# Patient Record
Sex: Male | Born: 1985
Health system: Southern US, Community
[De-identification: ages and names within clinical notes are randomized; demographics above are authoritative.]

## PROBLEM LIST (undated history)

## (undated) DIAGNOSIS — I10 Essential (primary) hypertension: Secondary | ICD-10-CM

## (undated) HISTORY — DX: Essential (primary) hypertension: I10

---
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
## 2016-03-15 ENCOUNTER — Ambulatory Visit
Admission: RE | Admit: 2016-03-15 | Discharge: 2016-03-15 | Disposition: A | Discharge status: Home / Self Care | Payer: No Typology Code available for payment source | Source: Ambulatory Visit | Attending: Physical Medicine and Rehabilitation | Admitting: Physical Medicine and Rehabilitation

## 2016-03-15 ENCOUNTER — Other Ambulatory Visit: Payer: Self-pay | Admitting: Physical Medicine and Rehabilitation

## 2016-03-15 DIAGNOSIS — Z0000 Encounter for general adult medical examination without abnormal findings: Secondary | ICD-10-CM

## 2017-11-08 IMAGING — CR DG CHEST 1V
1 series · 1 of 1 positions shown · non-contrast
Comparison: None in PACs

CLINICAL DATA: Routine lung screening by and pleuro earlier,
nonsmoker.

EXAM:
CHEST 1 VIEW

[view not recorded]
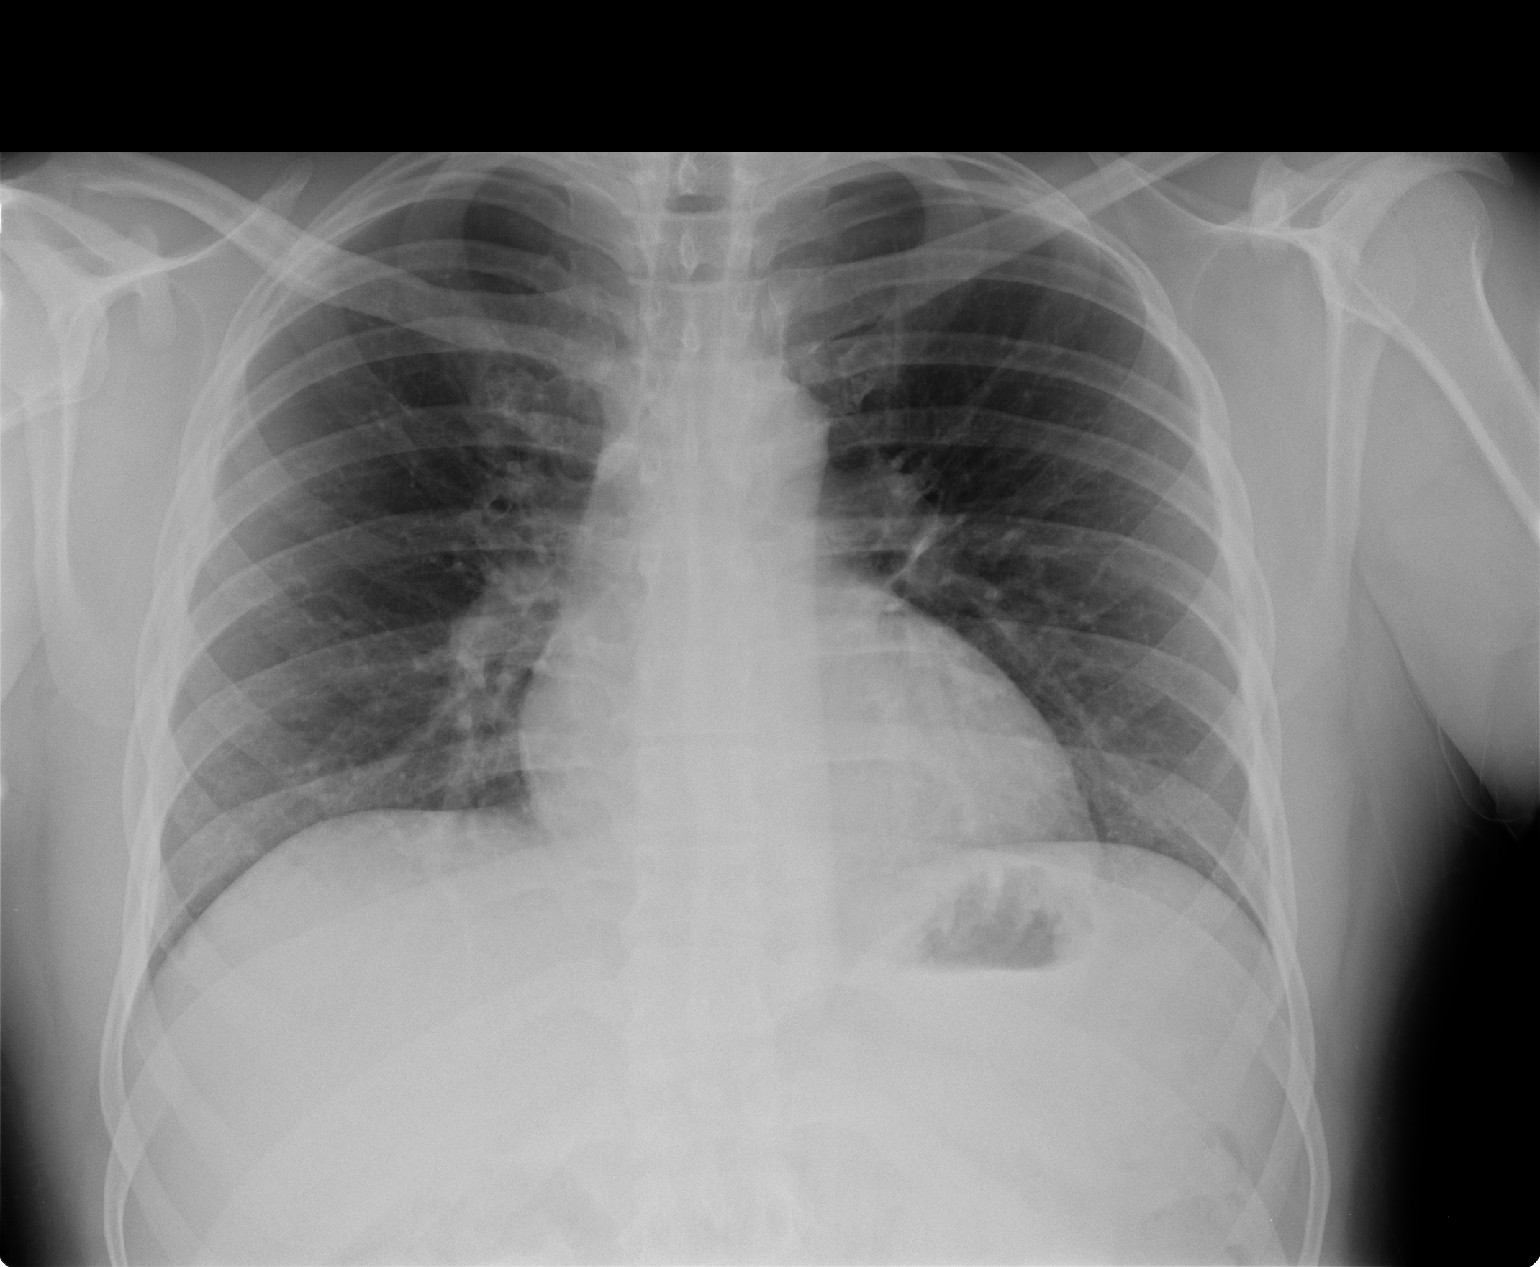

[1 of 1 positions shown; findings below may reference images not displayed]

FINDINGS: The lungs are adequately inflated and clear. The heart and pulmonary
vascularity are normal. The mediastinum is normal in width. There is
no pleural effusion. The bony thorax is unremarkable.
IMPRESSION: There is no active cardiopulmonary disease.

## 2019-07-14 ENCOUNTER — Encounter: Payer: Self-pay | Admitting: Urology

## 2019-07-14 ENCOUNTER — Ambulatory Visit (INDEPENDENT_AMBULATORY_CARE_PROVIDER_SITE_OTHER): Payer: 59 | Admitting: Urology

## 2019-07-14 VITALS — BP 163/101 | HR 109 | Temp 96.6°F | Ht 68.0 in | Wt 195.0 lb

## 2019-07-14 DIAGNOSIS — Z3009 Encounter for other general counseling and advice on contraception: Secondary | ICD-10-CM

## 2019-07-14 NOTE — Progress Notes (Signed)
H&P  Chief Complaint: Desire fpr sterility  History of Present Illness: 3 kids--11, 7 and 6.  Partner, Biomedical engineer Co native Works @ [employer] No prior GU hx.  Past Medical History:  Diagnosis Date  . Hypertension     History reviewed. No pertinent surgical history.  Home Medications:  Allergies as of 07/14/2019   No Known Allergies     Medication List       Accurate as of July 14, 2019 10:14 AM. If you have any questions, ask your nurse or doctor.        hydrochlorothiazide 12.5 MG tablet Commonly known as: HYDRODIURIL Take 12.5 mg by mouth daily.       Allergies: No Known Allergies  Family History  Problem Relation Age of Onset  . Hypertension Mother     Social History:  reports that he has never smoked. He does not have any smokeless tobacco history on file. He reports that he does not drink alcohol. No history on file for drug.  ROS: A complete review of systems was performed.  All systems are negative except for pertinent findings as noted.  Physical Exam:  Vital signs in last 24 hours: BP (!) 163/101   Pulse (!) 109   Temp (!) 96.6 F (35.9 C)   Ht 5\' 8"  (1.727 m)   Wt 88.5 kg   BMI 29.65 kg/m  Constitutional:  Alert and oriented, No acute distress Cardiovascular: Regular rate  Respiratory: Normal respiratory effort  Genitourinary: Normal male phallus, testes are descended bilaterally and non-tender and without masses, scrotum is normal in appearance without lesions or masses, perineum is normal on inspection. Lymphatic: No lymphadenopathy Neurologic: Grossly intact, no focal deficits Psychiatric: Normal mood and affect  Laboratory Data:  No results for input(s): WBC, HGB, HCT, PLT in the last 72 hours.  No results for input(s): NA, K, CL, GLUCOSE, BUN, CALCIUM, CREATININE in the last 72 hours.  Invalid input(s): CO3   No results found for this or any previous visit (from the past 24 hour(s)). No results found for this or any previous  visit (from the past 240 hour(s)).  Renal Function: No results for input(s): CREATININE in the last 168 hours. CrCl cannot be calculated (No successful lab value found.).  Radiologic Imaging: No results found.  Impression/Assessment:  Desire for sterility  Plan:   1. We discussed the procedure in detail, and I provided a vasectomy information packet.  He understands that this is a permanent form of sterilization. 2.  We discussed that vasectomy does not produce immediate infertility and he will need his semen sample showing no sperm before he can stop using any other form of contraception.  This typically takes 3 months. 3.  We discussed that there is a < 1/100 failure rate. 4.  I advised him to refrain from ejaculating for approximately 1 week. 5.  There is less than a 1% chance of developing a scrotal hematoma and equal chance of developing chronic scrotal pain. 6.   We will schedule his vasectomy at his convenience.

## 2019-07-14 NOTE — Progress Notes (Signed)

## 2019-08-25 ENCOUNTER — Encounter: Payer: No Typology Code available for payment source | Admitting: Urology

## 2019-09-29 ENCOUNTER — Encounter: Payer: No Typology Code available for payment source | Admitting: Urology

## 2019-10-08 ENCOUNTER — Other Ambulatory Visit: Payer: Self-pay | Admitting: Urology

## 2019-10-08 DIAGNOSIS — Z3009 Encounter for other general counseling and advice on contraception: Secondary | ICD-10-CM

## 2019-11-17 ENCOUNTER — Ambulatory Visit: Payer: No Typology Code available for payment source | Admitting: Urology

## 2019-11-17 ENCOUNTER — Other Ambulatory Visit: Payer: Self-pay

## 2020-01-11 ENCOUNTER — Encounter: Payer: Self-pay | Admitting: Emergency Medicine

## 2020-01-11 ENCOUNTER — Ambulatory Visit
Admission: EM | Admit: 2020-01-11 | Discharge: 2020-01-11 | Disposition: A | Discharge status: Home / Self Care | Payer: 59 | Attending: Emergency Medicine | Admitting: Emergency Medicine

## 2020-01-11 ENCOUNTER — Other Ambulatory Visit: Payer: Self-pay

## 2020-01-11 DIAGNOSIS — R1032 Left lower quadrant pain: Secondary | ICD-10-CM | POA: Diagnosis not present

## 2020-01-11 DIAGNOSIS — R319 Hematuria, unspecified: Secondary | ICD-10-CM

## 2020-01-11 DIAGNOSIS — R102 Pelvic and perineal pain: Secondary | ICD-10-CM | POA: Diagnosis present

## 2020-01-11 DIAGNOSIS — N200 Calculus of kidney: Secondary | ICD-10-CM

## 2020-01-11 LAB — POCT URINALYSIS DIP (MANUAL ENTRY)
Bilirubin, UA: NEGATIVE
Glucose, UA: NEGATIVE mg/dL
Ketones, POC UA: NEGATIVE mg/dL
Leukocytes, UA: NEGATIVE
Nitrite, UA: NEGATIVE
Protein Ur, POC: NEGATIVE mg/dL
Spec Grav, UA: 1.025 (ref 1.010–1.025)
Urobilinogen, UA: 0.2 E.U./dL
pH, UA: 6.5 (ref 5.0–8.0)

## 2020-01-11 MED ORDER — TRAMADOL HCL 50 MG PO TABS: 50.0000 mg | ORAL_TABLET | Freq: Two times a day (BID) | ORAL | 0 refills | Status: DC | PRN

## 2020-01-11 MED ORDER — IBUPROFEN 800 MG PO TABS
800.0000 mg | ORAL_TABLET | Freq: Three times a day (TID) | ORAL | 0 refills | Status: DC
Start: 2020-01-11 — End: 2021-06-08

## 2020-01-11 MED ORDER — KETOROLAC TROMETHAMINE 60 MG/2ML IM SOLN
60.0000 mg | Freq: Once | INTRAMUSCULAR | Status: AC
  Administered 2020-01-11: 60 mg via INTRAMUSCULAR

## 2020-01-11 NOTE — ED Triage Notes (Signed)
Pain below umbilicus area, no urinary s/s .  Hx of kidney stones

## 2020-01-11 NOTE — ED Provider Notes (Signed)
[facility]   CC: Abdominal discomfort  SUBJECTIVE:  David Cain is a 34 y.o. male who complains of abdominal discomfort x 1 day.  Patient denies a precipitating event or trauma.  Localizes the pain to the lower abdomen.  Pain is intermittent and describes it as sharp.  Has tried OTC medications without relief.  Denies aggravating factors.  Admits to similar symptoms in the past with kidney stones.  Denies fever, chills, nausea, vomiting, abdominal pain, flank pain, hematuria.    LMP: No LMP for male patient.  ROS: As in HPI.  All other pertinent ROS negative.     Past Medical History:  Diagnosis Date  . Hypertension    History reviewed. No pertinent surgical history. No Known Allergies No current facility-administered medications on file prior to encounter.   Current Outpatient Medications on File Prior to Encounter  Medication Sig Dispense Refill  . hydrochlorothiazide (HYDRODIURIL) 12.5 MG tablet Take 12.5 mg by mouth daily.     Social History   Socioeconomic History  . Marital status: Single    Spouse name: Not on file  . Number of children: Not on file  . Years of education: Not on file  . Highest education level: Not on file  Occupational History  . Not on file  Tobacco Use  . Smoking status: Never Smoker  . Smokeless tobacco: Never Used  Substance and Sexual Activity  . Alcohol use: Never  . Drug use: Never  . Sexual activity: Not on file  Other Topics Concern  . Not on file  Social History Narrative   2 sons 1 daughter    Social Determinants of Health   Financial Resource Strain:   . Difficulty of Paying Living Expenses:   Food Insecurity:   . Worried About Programme researcher, broadcasting/film/video in the Last Year:   . Barista in the Last Year:   Transportation Needs:   . Freight forwarder (Medical):   Marland Kitchen Lack of Transportation (Non-Medical):   Physical Activity:   . Days of Exercise per Week:   . Minutes of Exercise per Session:   Stress:     . Feeling of Stress :   Social Connections:   . Frequency of Communication with Friends and Family:   . Frequency of Social Gatherings with Friends and Family:   . Attends Religious Services:   . Active Member of Clubs or Organizations:   . Attends Banker Meetings:   Marland Kitchen Marital Status:   Intimate Partner Violence:   . Fear of Current or Ex-Partner:   . Emotionally Abused:   Marland Kitchen Physically Abused:   . Sexually Abused:    Family History  Problem Relation Age of Onset  . Hypertension Mother     OBJECTIVE:  Vitals:   01/11/20 1419 01/11/20 1431  BP: (!) 163/118   Pulse: (!) 113   Resp: 17   Temp: 98.8 F (37.1 C)   TempSrc: Oral   SpO2: 97%   Weight:  194 lb 0.1 oz (88 kg)  Height:  5\' 8"  (1.727 m)   General appearance: Alert in no acute distress HEENT: NCAT.  Oropharynx clear.  Lungs: clear to auscultation bilaterally without adventitious breath sounds Heart: regular rate and rhythm.   Abdomen: soft; non-distended; TTP over LLQ and suprapubic region; no guarding Back: no CVA tenderness Extremities: no edema; symmetrical with no gross deformities Skin: warm and dry Neurologic: Ambulates from chair to exam table without difficulty Psychological: alert and  cooperative; normal mood and affect  LABS:  Results for orders placed or performed during the hospital encounter of 01/11/20 (from the past 24 hour(s))  POCT urinalysis dipstick     Status: Abnormal   Collection Time: 01/11/20  2:48 PM  Result Value Ref Range   Color, UA yellow yellow   Clarity, UA clear clear   Glucose, UA negative negative mg/dL   Bilirubin, UA negative negative   Ketones, POC UA negative negative mg/dL   Spec Grav, UA 1.025 1.010 - 1.025   Blood, UA moderate (A) negative   pH, UA 6.5 5.0 - 8.0   Protein Ur, POC negative negative mg/dL   Urobilinogen, UA 0.2 0.2 or 1.0 E.U./dL   Nitrite, UA Negative Negative   Leukocytes, UA Negative Negative     ASSESSMENT & PLAN:  1. LLQ  discomfort   2. Suprapubic discomfort   3. Hematuria, unspecified type   4. Kidney stone     Meds ordered this encounter  Medications  . ibuprofen (ADVIL) 800 MG tablet    Sig: Take 1 tablet (800 mg total) by mouth 3 (three) times daily.    Dispense:  21 tablet    Refill:  0    Order Specific Question:   Supervising Provider    Answer:   Raylene Everts [[id number]]  . traMADol (ULTRAM) 50 MG tablet    Sig: Take 1 tablet (50 mg total) by mouth every 12 (twelve) hours as needed.    Dispense:  10 tablet    Refill:  0    Order Specific Question:   Supervising Provider    Answer:   Raylene Everts [[id number]]  . ketorolac (TORADOL) injection 60 mg   Toradol prescribed.  Take as directed and to completion Drink plenty of fluids and get rest Ibuprofen prescribed.  Take as directed Tramadol for severe break-through pain.  DO NOT TAKE WHILE DRIVING OR OPERATING HEAVY MACHINERY Follow up with PCP if symptoms persist Return or go to ER if you have any new or worsening symptoms (difficulty urinating, blood in urine, pain that does not moderate with medication, fever, chills, abdominal pain, etc...)   Outlined signs and symptoms indicating need for more acute intervention. Patient verbalized understanding. After Visit Summary given.     Lestine Box, PA-C 01/11/20 1505

## 2020-01-11 NOTE — Discharge Instructions (Signed)
Toradol prescribed.  Take as directed and to completion Drink plenty of fluids and get rest Ibuprofen prescribed.  Take as directed Tramadol for severe break-through pain.  DO NOT TAKE WHILE DRIVING OR OPERATING HEAVY MACHINERY Follow up with PCP if symptoms persist Return or go to ER if you have any new or worsening symptoms (difficulty urinating, blood in urine, pain that does not moderate with medication, fever, chills, abdominal pain, etc...)

## 2020-01-13 LAB — URINE CULTURE: Culture: NO GROWTH

## 2020-01-23 ENCOUNTER — Ambulatory Visit
Admission: EM | Admit: 2020-01-23 | Discharge: 2020-01-23 | Disposition: A | Discharge status: Home / Self Care | Payer: 59 | Attending: Emergency Medicine | Admitting: Emergency Medicine

## 2020-01-23 DIAGNOSIS — R300 Dysuria: Secondary | ICD-10-CM | POA: Diagnosis present

## 2020-01-23 DIAGNOSIS — Z113 Encounter for screening for infections with a predominantly sexual mode of transmission: Secondary | ICD-10-CM | POA: Insufficient documentation

## 2020-01-23 LAB — POCT URINALYSIS DIP (MANUAL ENTRY)
Glucose, UA: NEGATIVE mg/dL
Leukocytes, UA: NEGATIVE
Nitrite, UA: NEGATIVE
Protein Ur, POC: NEGATIVE mg/dL
Spec Grav, UA: 1.02 (ref 1.010–1.025)
Urobilinogen, UA: 0.2 E.U./dL
pH, UA: 6.5 (ref 5.0–8.0)

## 2020-01-23 MED ORDER — PHENAZOPYRIDINE HCL 100 MG PO TABS
100.0000 mg | ORAL_TABLET | Freq: Three times a day (TID) | ORAL | 0 refills | Status: DC | PRN
Start: 2020-01-23 — End: 2021-06-08

## 2020-01-23 NOTE — ED Triage Notes (Signed)
Pt returns without much improvement in symptoms since last visit

## 2020-01-23 NOTE — ED Provider Notes (Signed)
[facility]   Chief Complaint  Patient presents with   Flank Pain     SUBJECTIVE:  David Cain is a 34 y.o. male who complains of dysuria for the past few days.  Was previously seen for kidney stone and was prescribed Toradol ibuprofen.  Patient reports symptom has resolved.  Denies having some irritation while urinating.  Denies any precipitating event.  Report he is sexually active with 1 male partner.  Denies penile discharge. Has tried OTC medications without relief.  Symptoms are made worse with urination.  Admits to similar symptoms in the past.  Denies fever, chills, nausea, vomiting, abdominal pain, flank pain or hematuria.    LMP: No LMP for male patient.  ROS: As in HPI.  All other pertinent ROS negative.     Past Medical History:  Diagnosis Date   Hypertension    History reviewed. No pertinent surgical history. No Known Allergies No current facility-administered medications on file prior to encounter.   Current Outpatient Medications on File Prior to Encounter  Medication Sig Dispense Refill   hydrochlorothiazide (HYDRODIURIL) 12.5 MG tablet Take 12.5 mg by mouth daily.     ibuprofen (ADVIL) 800 MG tablet Take 1 tablet (800 mg total) by mouth 3 (three) times daily. 21 tablet 0   traMADol (ULTRAM) 50 MG tablet Take 1 tablet (50 mg total) by mouth every 12 (twelve) hours as needed. 10 tablet 0   Social History   Socioeconomic History   Marital status: Single    Spouse name: Not on file   Number of children: Not on file   Years of education: Not on file   Highest education level: Not on file  Occupational History   Not on file  Tobacco Use   Smoking status: Never Smoker   Smokeless tobacco: Never Used  Substance and Sexual Activity   Alcohol use: Never   Drug use: Never   Sexual activity: Not on file  Other Topics Concern   Not on file  Social History Narrative   2 sons 1 daughter    Social Determinants of Health    Financial Resource Strain:    Difficulty of Paying Living Expenses:   Food Insecurity:    Worried About Programme researcher, broadcasting/film/video in the Last Year:    Barista in the Last Year:   Transportation Needs:    Freight forwarder (Medical):    Lack of Transportation (Non-Medical):   Physical Activity:    Days of Exercise per Week:    Minutes of Exercise per Session:   Stress:    Feeling of Stress :   Social Connections:    Frequency of Communication with Friends and Family:    Frequency of Social Gatherings with Friends and Family:    Attends Religious Services:    Active Member of Clubs or Organizations:    Attends Engineer, structural:    Marital Status:   Intimate Partner Violence:    Fear of Current or Ex-Partner:    Emotionally Abused:    Physically Abused:    Sexually Abused:    Family History  Problem Relation Age of Onset   Hypertension Mother     OBJECTIVE:  Vitals:   01/23/20 1439  BP: (!) 153/123  Pulse: 100  Resp: 16  Temp: 98.4 F (36.9 C)  TempSrc: Oral  SpO2: 98%   General appearance: AOx3 in no acute distress HEENT: NCAT.  Oropharynx clear.  Lungs: clear to auscultation bilaterally  without adventitious breath sounds Heart: regular rate and rhythm.  Radial pulses 2+ symmetrical bilaterally Abdomen: soft; non-distended; no tenderness; bowel sounds present; no guarding or rebound tenderness Back: no CVA tenderness Extremities: no edema; symmetrical with no gross deformities Skin: warm and dry Neurologic: Ambulates from chair to exam table without difficulty Psychological: alert and cooperative; normal mood and affect  Labs Reviewed  POCT URINALYSIS DIP (MANUAL ENTRY) - Abnormal; Notable for the following components:      Result Value   Bilirubin, UA small (*)    Ketones, POC UA trace (5) (*)    Blood, UA small (*)    All other components within normal limits  URINE CULTURE  CYTOLOGY, (ORAL, ANAL, URETHRAL)  ANCILLARY ONLY    ASSESSMENT & PLAN:  1. Dysuria   2. Screening for STD (sexually transmitted disease)     Meds ordered this encounter  Medications   phenazopyridine (PYRIDIUM) 100 MG tablet    Sig: Take 1 tablet (100 mg total) by mouth 3 (three) times daily as needed for pain.    Dispense:  10 tablet    Refill:  0   Patient is stable at discharge.  Pyridium was prescribed.  Will await STD screening and urine culture test.  Discharge instructions Urine culture sent.  We will call you with the results. Penile self swab was completed Push fluids and get plenty of rest.   Take pyridium as prescribed and as needed for symptomatic relief Follow up with PCP if symptoms persists Return here or go to ER if you have any new or worsening symptoms such as fever, worsening abdominal pain, nausea/vomiting, flank pain, etc...   Outlined signs and symptoms indicating need for more acute intervention. Patient verbalized understanding. After Visit Summary given.     Durward Parcel, FNP 01/23/20 1521

## 2020-01-23 NOTE — Discharge Instructions (Addendum)
Urine culture sent.  We will call you with the results. Penile self swab was completed Push fluids and get plenty of rest.   Take pyridium as prescribed and as needed for symptomatic relief Follow up with PCP if symptoms persists Return here or go to ER if you have any new or worsening symptoms such as fever, worsening abdominal pain, nausea/vomiting, flank pain, etc..Marland Kitchen

## 2020-01-24 LAB — URINE CULTURE: Culture: NO GROWTH

## 2020-01-26 LAB — CYTOLOGY, (ORAL, ANAL, URETHRAL) ANCILLARY ONLY
Chlamydia: NEGATIVE
Comment: NEGATIVE
Comment: NEGATIVE
Comment: NORMAL
Neisseria Gonorrhea: NEGATIVE
Trichomonas: NEGATIVE

## 2021-03-16 ENCOUNTER — Other Ambulatory Visit: Payer: Self-pay

## 2021-03-16 ENCOUNTER — Ambulatory Visit
Admission: EM | Admit: 2021-03-16 | Discharge: 2021-03-16 | Disposition: A | Discharge status: Home / Self Care | Payer: 59 | Attending: Emergency Medicine | Admitting: Emergency Medicine

## 2021-03-16 DIAGNOSIS — Z1152 Encounter for screening for COVID-19: Secondary | ICD-10-CM

## 2021-03-16 NOTE — ED Triage Notes (Signed)
Pt wants covid test from c/o headache the other day, no other symptoms

## 2021-03-18 LAB — NOVEL CORONAVIRUS, NAA: SARS-CoV-2, NAA: NOT DETECTED

## 2021-03-18 LAB — SARS-COV-2, NAA 2 DAY TAT

## 2021-05-18 ENCOUNTER — Encounter (HOSPITAL_COMMUNITY): Payer: Self-pay

## 2021-05-18 ENCOUNTER — Emergency Department (HOSPITAL_COMMUNITY)
Admission: EM | Admit: 2021-05-18 | Discharge: 2021-05-18 | Disposition: A | Discharge status: Home / Self Care | Payer: 59 | Attending: Emergency Medicine | Admitting: Emergency Medicine

## 2021-05-18 ENCOUNTER — Other Ambulatory Visit: Payer: Self-pay

## 2021-05-18 DIAGNOSIS — Z79899 Other long term (current) drug therapy: Secondary | ICD-10-CM | POA: Insufficient documentation

## 2021-05-18 DIAGNOSIS — I10 Essential (primary) hypertension: Secondary | ICD-10-CM | POA: Diagnosis not present

## 2021-05-18 DIAGNOSIS — E871 Hypo-osmolality and hyponatremia: Secondary | ICD-10-CM | POA: Insufficient documentation

## 2021-05-18 DIAGNOSIS — R0981 Nasal congestion: Secondary | ICD-10-CM | POA: Insufficient documentation

## 2021-05-18 DIAGNOSIS — R5383 Other fatigue: Secondary | ICD-10-CM

## 2021-05-18 DIAGNOSIS — Z20822 Contact with and (suspected) exposure to covid-19: Secondary | ICD-10-CM | POA: Insufficient documentation

## 2021-05-18 DIAGNOSIS — I159 Secondary hypertension, unspecified: Secondary | ICD-10-CM

## 2021-05-18 DIAGNOSIS — R519 Headache, unspecified: Secondary | ICD-10-CM | POA: Insufficient documentation

## 2021-05-18 LAB — BASIC METABOLIC PANEL
Anion gap: 9 (ref 5–15)
BUN: 9 mg/dL (ref 6–20)
CO2: 26 mmol/L (ref 22–32)
Calcium: 9.2 mg/dL (ref 8.9–10.3)
Chloride: 98 mmol/L (ref 98–111)
Creatinine, Ser: 1.03 mg/dL (ref 0.61–1.24)
GFR, Estimated: >60 mL/min (ref >60)
Glucose, Bld: 98 mg/dL (ref 70–99)
Potassium: 3.7 mmol/L (ref 3.5–5.1)
Sodium: 133 mmol/L — ABNORMAL LOW (ref 135–145)

## 2021-05-18 LAB — CBC WITH DIFFERENTIAL/PLATELET
Abs Immature Granulocytes: 0.05 10*3/uL (ref 0.00–0.07)
Basophils Absolute: 0 10*3/uL (ref 0.0–0.1)
Basophils Relative: 0 %
Eosinophils Absolute: 0 10*3/uL (ref 0.0–0.5)
Eosinophils Relative: 0 %
HCT: 51.5 % (ref 39.0–52.0)
Hemoglobin: 16.9 g/dL (ref 13.0–17.0)
Immature Granulocytes: 1 %
Lymphocytes Relative: 11 %
Lymphs Abs: 1.1 10*3/uL (ref 0.7–4.0)
MCH: 29.1 pg (ref 26.0–34.0)
MCHC: 32.8 g/dL (ref 30.0–36.0)
MCV: 88.8 fL (ref 80.0–100.0)
Monocytes Absolute: 0.7 10*3/uL (ref 0.1–1.0)
Monocytes Relative: 7 %
Neutro Abs: 8.3 10*3/uL — ABNORMAL HIGH (ref 1.7–7.7)
Neutrophils Relative %: 81 %
Platelets: 274 10*3/uL (ref 150–400)
RBC: 5.8 MIL/uL (ref 4.22–5.81)
RDW: 13 % (ref 11.5–15.5)
WBC: 10.2 10*3/uL (ref 4.0–10.5)
nRBC: 0 % (ref 0.0–0.2)

## 2021-05-18 MED ORDER — AMLODIPINE BESYLATE 5 MG PO TABS
5.0000 mg | ORAL_TABLET | Freq: Once | ORAL | Status: AC
  Administered 2021-05-18: 5 mg via ORAL
  Filled 2021-05-18: qty 1

## 2021-05-18 NOTE — ED Provider Notes (Signed)
[facility] EMERGENCY DEPARTMENT Provider Note   CSN: [id number] Arrival date & time: 05/18/21  2056     History Chief Complaint  Patient presents with   Headache    David Cain is a 35 y.o. male.  HPI  Patient with significant medical history of hypertension presents to the emergency department with headache.  He states that headache started today, states he initially felt a headache behind his neck and then radiate all throughout his head, headache was constant, he had no associated photophobia, change in vision, paresthesia or weakness upper lower extremities, denies any recent head trauma, is not on anticoagulant.  She states he currently does not have much of a headache right now,  just feels fatigued.  He notes that earlier today he had an episode where he felt short of breath, states he was laying on the couch and felt like his chest was a little tight but it went away on its own, he currently does not have this complaint this time.  He also notes that he has been having some subjective fevers and chills, he states this all started today decrease in appetite, he denies  nasal congestion, sore throat, cough, stomach pain, nausea, vomiting, diarrhea, general body aches.  He denies  recent sick contacts, is vaccine against COVID but has not gotten his flu shot.  He has no other complaints at this time.  He denies any leaving or aggravating factors at this time.  Past Medical History:  Diagnosis Date   Hypertension     There are no problems to display for this patient.   No past surgical history on file.     Family History  Problem Relation Age of Onset   Hypertension Mother     Social History   Tobacco Use   Smoking status: Never   Smokeless tobacco: Never  Substance Use Topics   Alcohol use: Never   Drug use: Never    Home Medications Prior to Admission medications   Medication Sig Start Date End Date Taking? Authorizing Provider  hydrochlorothiazide  (HYDRODIURIL) 12.5 MG tablet Take 12.5 mg by mouth daily. 07/11/19   [provider]  ibuprofen (ADVIL) 800 MG tablet Take 1 tablet (800 mg total) by mouth 3 (three) times daily. 01/11/20   Wurst, [place], PA-C  phenazopyridine (PYRIDIUM) 100 MG tablet Take 1 tablet (100 mg total) by mouth 3 (three) times daily as needed for pain. 01/23/20   Avegno, Zachery Dakins, FNP  traMADol (ULTRAM) 50 MG tablet Take 1 tablet (50 mg total) by mouth every 12 (twelve) hours as needed. 01/11/20   Wurst, [place], PA-C    Allergies    Patient has no known allergies.  Review of Systems   Review of Systems  Constitutional:  Positive for appetite change and fatigue. Negative for chills and fever.  HENT:  Negative for congestion.   Respiratory:  Negative for shortness of breath.   Cardiovascular:  Negative for chest pain.  Gastrointestinal:  Negative for abdominal pain, diarrhea, nausea and vomiting.  Genitourinary:  Negative for enuresis.  Musculoskeletal:  Negative for back pain.  Skin:  Negative for rash.  Neurological:  Positive for headaches. Negative for dizziness.  Hematological:  Does not bruise/bleed easily.   Physical Exam Updated Vital Signs BP (!) 178/116 (BP Location: Left Arm) Comment: PT is on BP medication. pt sates he took his BP medication today  Pulse 87   Temp 98.3 F (36.8 C) (Oral)   Resp 20   Ht  5\' 8"  (1.727 m)   Wt 95.3 kg   SpO2 99%   BMI 31.93 kg/m   Physical Exam Vitals and nursing note reviewed.  Constitutional:      General: He is not in acute distress.    Appearance: He is not ill-appearing.  HENT:     Head: Normocephalic and atraumatic.     Right Ear: Tympanic membrane, ear canal and external ear normal.     Left Ear: Tympanic membrane, ear canal and external ear normal.     Nose: Congestion present.     Comments: Erythematous turbinates bilaterally.    Mouth/Throat:     Mouth: Mucous membranes are moist.     Pharynx: Oropharynx is clear. No oropharyngeal  exudate or posterior oropharyngeal erythema.  Eyes:     Conjunctiva/sclera: Conjunctivae normal.  Cardiovascular:     Rate and Rhythm: Normal rate and regular rhythm.     Pulses: Normal pulses.     Heart sounds: No murmur heard.   No friction rub. No gallop.  Pulmonary:     Effort: No respiratory distress.     Breath sounds: No wheezing, rhonchi or rales.  Abdominal:     Palpations: Abdomen is soft.     Tenderness: There is no abdominal tenderness. There is no right CVA tenderness or left CVA tenderness.  Musculoskeletal:     Comments: Moving all 4 extremities, 5/5 strength, neurovascularly intact.  Skin:    General: Skin is warm and dry.  Neurological:     Mental Status: He is alert.     Comments: No facial asymmetry, no difficult word finding, no slurring of his words, able to follow two-step commands, Weakness present my exam, gait fully intact.  Psychiatric:        Mood and Affect: Mood normal.    ED Results / Procedures / Treatments   Labs (all labs ordered are listed, but only abnormal results are displayed) Labs Reviewed  RESP PANEL BY RT-PCR (FLU A&B, COVID) ARPGX2  BASIC METABOLIC PANEL  CBC WITH DIFFERENTIAL/PLATELET    EKG None  Radiology No results found.  Procedures Procedures   Medications Ordered in ED Medications  amLODipine (NORVASC) tablet 5 mg (has no administration in time range)    ED Course  I have reviewed the triage vital signs and the nursing notes.  Pertinent labs & imaging results that were available during my care of the patient were reviewed by me and considered in my medical decision making (see chart for details).    MDM Rules/Calculators/A&P                          Initial impression-presents with fatigue, he is alert, does not appear acute stress, vital signs normal for hypertension BP of 178/116, unclear etiology, will check electrolytes, hemoglobin, EKG and reassess.  Work-up-CBC is unremarkable, BMP shows hyponatremia  133, EKG sinus without signs of ischemia.  Reassessment-patient is reassessed continued no complaints at this time.  Patient agreed for discharge.  Rule out-low suspicion for ACS patient has chest pain, shortness of breath, EKG is without signs of ischemia.  Low suspicion for PE as patient is PERC negative.  I have low suspicion for electrolyte abnormality as BMP is unremarkable.  I have low suspicion for hypertensive emergency or urgency as there is no signs of organ damage present on exam within lab work.  I have low suspicion for CVA or intracranial head bleed as there is no focal deficits  present my exam, patient has no risk factor he is not on anticoagulant and denies any recent head trauma.  Plan-  Fatigue-unclear etiology I suspect probable viral infection, will have him follow-up on his respiratory panel, if positive for COVID/influenza would not advise for antiviral treatment as he has very mild symptoms and is not immunocompromise low risk factors for adverse outcome. Hypertension-slightly elevated today, we will have him continue to monitor this, follow-up with his PCP for further evaluation.  Vital signs have remained stable, no indication for hospital admission.   Patient given at home care as well strict return precautions.  Patient verbalized that they understood agreed to said plan.  Final Clinical Impression(s) / ED Diagnoses Final diagnoses:  None    Rx / DC Orders ED Discharge Orders     None        Barnie Del 05/18/21 2303    Cathren Laine, MD 05/20/21 [phone]

## 2021-05-18 NOTE — ED Triage Notes (Signed)
Pt c/o headache that started this morning, states he took Excedrin with no relief. Also c/o congestion.

## 2021-05-18 NOTE — Discharge Instructions (Signed)
Lab work and exam are all reassuring.  Your respiratory panel is pending at this time please follow-up on your MyChart account.  If you are COVID-positive you must quarantine for 5 days after that you must wear a mask for additional 5 more days.    your blood pressure was slightly elevated today please continue with your blood pressure medications, if they continue to stay high please follow your PCP for further evaluation.  Come back to the emergency department if you develop chest pain, shortness of breath, severe abdominal pain, uncontrolled nausea, vomiting, diarrhea.

## 2021-05-19 LAB — RESP PANEL BY RT-PCR (FLU A&B, COVID) ARPGX2
Influenza A by PCR: NEGATIVE
Influenza B by PCR: NEGATIVE
SARS Coronavirus 2 by RT PCR: NEGATIVE

## 2021-06-08 ENCOUNTER — Encounter: Payer: Self-pay | Admitting: Emergency Medicine

## 2021-06-08 ENCOUNTER — Ambulatory Visit
Admission: EM | Admit: 2021-06-08 | Discharge: 2021-06-08 | Disposition: A | Discharge status: Home / Self Care | Payer: 59 | Attending: Emergency Medicine | Admitting: Emergency Medicine

## 2021-06-08 ENCOUNTER — Other Ambulatory Visit: Payer: Self-pay

## 2021-06-08 DIAGNOSIS — I10 Essential (primary) hypertension: Secondary | ICD-10-CM | POA: Diagnosis not present

## 2021-06-08 DIAGNOSIS — H5789 Other specified disorders of eye and adnexa: Secondary | ICD-10-CM

## 2021-06-08 NOTE — Discharge Instructions (Addendum)
New contacts.  Systane lubricating eyedrops as often as you want.  Cool compresses should also help.  Please follow-up with your eye doctor if you continue to have problems with your contacts.  You may need to switch brands.  Please keep an eye on your blood pressure. Decrease your salt intake. diet and exercise will lower your blood pressure significantly. It is important to keep your blood pressure under good control, as having a elevated blood pressure for prolonged periods of time significantly increases your risk of stroke, heart attacks, kidney damage, eye damage, and other problems. Measure your blood pressure once a day, preferably at the same time every day. Keep a log of this and bring it to your next doctor's appointment.  Bring your blood pressure cuff as well. Return immediately to the ER if you start having chest pain, headache, problems seeing, problems talking, problems walking, if you feel like you're about to pass out, if you do pass out, if you have a seizure, or for any other concerns.  Go to www.goodrx.com  or www.costplusdrugs.com to look up your medications. This will give you a list of where you can find your prescriptions at the most affordable prices. Or ask the pharmacist what the cash price is, or if they have any other discount programs available to help make your medication more affordable. This can be less expensive than what you would pay with insurance.

## 2021-06-08 NOTE — ED Provider Notes (Signed)
HPI  SUBJECTIVE:  David Cain is a 35 y.o. male who presents with left eye irritation, increased tearing, conjunctival injection ,burning pain when he puts his left contact lens in for the past week.  He has been wearing glasses for the past 4 to 5 days and states that if symptoms have gotten better.  He tried inserting an old contact lens in last night, which irritated his eye, and then he put in a new contact, which continued to irritate his eye.  He does not have any symptoms when he is not wearing contacts.  He has been wearing the same pair of contacts for over a month, but states that he takes them out daily and disinfect them.  He is supposed to change his contacts every month.  Denies sleeping in his contacts.  No change in the contact solution.  No blurry vision, double vision, visual loss, eye pain, pain with extraocular movements, discharge, photophobia, foreign body sensation.  He has tried Clear Eyes eyedrops, and discontinuing the contacts with improvement in his symptoms.  Symptoms are worse when he tries to wear his contacts.  He has a past medical history of hypertension.  He has not yet taken his blood pressure medication today.  He states that his blood pressure ranges in the 130s to 140s over 80s at home.  He is otherwise compliant with his hydrochlorothiazide 12.5 mg daily.  PMD: [facility].  Ophthalmology: In [place].    Past Medical History:  Diagnosis Date   Hypertension     History reviewed. No pertinent surgical history.  Family History  Problem Relation Age of Onset   Hypertension Mother     Social History   Tobacco Use   Smoking status: Never   Smokeless tobacco: Never  Substance Use Topics   Alcohol use: Never   Drug use: Never    No current facility-administered medications for this encounter.  Current Outpatient Medications:    hydrochlorothiazide (HYDRODIURIL) 12.5 MG tablet, Take 12.5 mg by mouth daily., Disp: , Rfl:   No Known  Allergies   ROS  As noted in HPI.   Physical Exam  BP (!) 163/111   Pulse 85   Temp 98.8 F (37.1 C) (Oral)   Resp 16   SpO2 97%   Constitutional: Well developed, well nourished, no acute distress Eyes:  EOMI, conjunctiva normal bilaterally mildly, no pain with EOMs.  No conjunctival injection, discharge.  No foreign body seen on lid eversion.  No abrasion seen on fluorescein exam.  Negative hyphema. Visual acuity corrected R 20/16 L 20/16  HENT: Normocephalic, atraumatic,mucus membranes moist Respiratory: Normal inspiratory effort Cardiovascular: Normal rate GI: nondistended skin: No rash, skin intact Musculoskeletal: no deformities Neurologic: Alert & oriented x 3, no focal neuro deficits Psychiatric: Speech and behavior appropriate   ED Course   Medications - No data to display  Orders Placed This Encounter  Procedures   Visual acuity screening    Standing Status:   Standing    Number of Occurrences:   1    No results found for this or any previous visit (from the past 24 hour(s)). No results found.  ED Clinical Impression  1. Irritation of left eye   2. Elevated blood pressure reading in office with diagnosis of hypertension      ED Assessment/Plan  1.  Eye irritation.  Suspected just from old contacts.  will have him start with a fresh pair of contact lenses.  He is to follow-up with  his ophthalmologist if his symptoms persist.  Advised Systane eyedrops.  2.  Blood pressure noted.  Has not yet gotten to today's dose of hydrochlorothiazide.  It has been elevated over the past year per chart review, however, patient states that his blood pressure is in the 130s to 140s over 80s consistently at home.  He has no other symptoms.  He will keep an eye on this.  Discussed MDM, treatment plan, and plan for follow-up with patient. Discussed sn/sx that should prompt return to the ED. patient agrees with plan.   No orders of the defined types were placed in this  encounter.     *This clinic note was created using Dragon dictation software. Therefore, there may be occasional mistakes despite careful proofreading.  ?    Domenick Gong, MD 06/09/21 [phone]

## 2021-06-08 NOTE — ED Triage Notes (Signed)
Left eye discomfort for 1 week.  He is supposed to change contacts every 30 days, but continued to use his last batch for 1.5 months (he did remove and clean every night.)  Denies vision problems, just discomfort when he tries to use contact in left eye.

## 2022-04-06 ENCOUNTER — Ambulatory Visit
Admission: EM | Admit: 2022-04-06 | Discharge: 2022-04-06 | Disposition: A | Discharge status: Home / Self Care | Payer: 59 | Attending: Nurse Practitioner | Admitting: Nurse Practitioner

## 2022-04-06 ENCOUNTER — Encounter: Payer: Self-pay | Admitting: Emergency Medicine

## 2022-04-06 ENCOUNTER — Other Ambulatory Visit: Payer: Self-pay

## 2022-04-06 DIAGNOSIS — Z202 Contact with and (suspected) exposure to infections with a predominantly sexual mode of transmission: Secondary | ICD-10-CM | POA: Diagnosis not present

## 2022-04-06 DIAGNOSIS — Z113 Encounter for screening for infections with a predominantly sexual mode of transmission: Secondary | ICD-10-CM | POA: Diagnosis present

## 2022-04-06 NOTE — ED Provider Notes (Signed)
[facility]    CSN: [id number] Arrival date & time: 04/06/22  1936      History   Chief Complaint Chief Complaint  Patient presents with   Exposure to STD    HPI David Cain is a 36 y.o. male.   The history is provided by the patient.   Patient presents for STI testing.  Patient denies penile discharge, dysuria, frequency, urgency, hesitancy, abdominal pain, scrotal/testicular pain/swelling, or back pain.  Patient reports 2 male partners in the past 90 days.  Patient reports he has not been using condoms consistently since this time.  He denies any previous STI or STD at this time.  Patient declines HIV/RPR testing.  Past Medical History:  Diagnosis Date   Hypertension     There are no problems to display for this patient.   History reviewed. No pertinent surgical history.     Home Medications    Prior to Admission medications   Medication Sig Start Date End Date Taking? Authorizing Provider  hydrochlorothiazide (HYDRODIURIL) 12.5 MG tablet Take 12.5 mg by mouth daily. 07/11/19   [provider]    Family History Family History  Problem Relation Age of Onset   Hypertension Mother     Social History Social History   Tobacco Use   Smoking status: Never   Smokeless tobacco: Never  Substance Use Topics   Alcohol use: Never   Drug use: Never     Allergies   Patient has no known allergies.   Review of Systems Review of Systems Per HPI  Physical Exam Triage Vital Signs ED Triage Vitals [04/06/22 1944]  Enc Vitals Group     BP (!) 174/113     Pulse Rate (!) 113     Resp 17     Temp 98.1 F (36.7 C)     Temp Source Oral     SpO2 98 %     Weight      Height      Head Circumference      Peak Flow      Pain Score      Pain Loc      Pain Edu?      Excl. in GC?    No data found.  Updated Vital Signs BP (!) 174/113 (BP Location: Right Arm)   Pulse (!) 113   Temp 98.1 F (36.7 C) (Oral)   Resp 17   SpO2 98%    Visual Acuity Right Eye Distance:   Left Eye Distance:   Bilateral Distance:    Right Eye Near:   Left Eye Near:    Bilateral Near:     Physical Exam Vitals and nursing note reviewed.  Constitutional:      General: He is not in acute distress.    Appearance: Normal appearance.  HENT:     Head: Normocephalic.  Eyes:     Pupils: Pupils are equal, round, and reactive to light.  Pulmonary:     Effort: Pulmonary effort is normal.  Musculoskeletal:     Cervical back: Normal range of motion.  Lymphadenopathy:     Cervical: No cervical adenopathy.  Skin:    General: Skin is warm and dry.  Neurological:     General: No focal deficit present.     Mental Status: He is alert and oriented to person, place, and time.  Psychiatric:        Mood and Affect: Mood normal.        Behavior: Behavior  normal.      UC Treatments / Results  Labs (all labs ordered are listed, but only abnormal results are displayed) Labs Reviewed  CYTOLOGY, (ORAL, ANAL, URETHRAL) ANCILLARY ONLY    EKG   Radiology No results found.  Procedures Procedures (including critical care time)  Medications Ordered in UC Medications - No data to display  Initial Impression / Assessment and Plan / UC Course  I have reviewed the triage vital signs and the nursing notes.  Pertinent labs & imaging results that were available during my care of the patient were reviewed by me and considered in my medical decision making (see chart for details).  Patient presents for STI testing.  Patient is asymptomatic at this time.  Cytology results are pending.  Patient advised to increase condom use with each sexual encounter.  Patient advised that he will be contacted if his results are positive to provide treatment.  Follow-up as needed.  Final Clinical Impressions(s) / UC Diagnoses   Final diagnoses:  Screening examination for sexually transmitted disease     Discharge Instructions      Your results should be  available within the next 48 to 72 hours.  If you have access to MyChart, you will be able to see the results there.  If your results are positive, you will be contacted to discuss treatment. Condom use with each sexual encounter. Follow-up as needed.      ED Prescriptions   None    PDMP not reviewed this encounter.   Abran Cantor, NP 04/06/22 [phone]

## 2022-04-06 NOTE — ED Triage Notes (Signed)
Pt reports had unprotected sex x1 week and reports would like to be tested for STD just in case. Denies symptoms or pain.

## 2022-04-06 NOTE — Discharge Instructions (Addendum)
Your results should be available within the next 48 to 72 hours.  If you have access to MyChart, you will be able to see the results there.  If your results are positive, you will be contacted to discuss treatment. Condom use with each sexual encounter. Follow-up as needed.

## 2022-04-10 LAB — CYTOLOGY, (ORAL, ANAL, URETHRAL) ANCILLARY ONLY
Chlamydia: NEGATIVE
Comment: NEGATIVE
Comment: NEGATIVE
Comment: NORMAL
Neisseria Gonorrhea: NEGATIVE
Trichomonas: NEGATIVE

## 2022-05-04 ENCOUNTER — Emergency Department (HOSPITAL_COMMUNITY): Payer: 59

## 2022-05-04 ENCOUNTER — Encounter (HOSPITAL_COMMUNITY): Payer: Self-pay

## 2022-05-04 ENCOUNTER — Emergency Department (HOSPITAL_COMMUNITY)
Admission: EM | Admit: 2022-05-04 | Discharge: 2022-05-04 | Disposition: A | Discharge status: Home / Self Care | Payer: 59 | Attending: Emergency Medicine | Admitting: Emergency Medicine

## 2022-05-04 ENCOUNTER — Other Ambulatory Visit: Payer: Self-pay

## 2022-05-04 DIAGNOSIS — R000 Tachycardia, unspecified: Secondary | ICD-10-CM | POA: Insufficient documentation

## 2022-05-04 DIAGNOSIS — R0789 Other chest pain: Secondary | ICD-10-CM | POA: Diagnosis not present

## 2022-05-04 DIAGNOSIS — M25512 Pain in left shoulder: Secondary | ICD-10-CM | POA: Diagnosis not present

## 2022-05-04 DIAGNOSIS — Z79899 Other long term (current) drug therapy: Secondary | ICD-10-CM | POA: Insufficient documentation

## 2022-05-04 DIAGNOSIS — I10 Essential (primary) hypertension: Secondary | ICD-10-CM | POA: Diagnosis not present

## 2022-05-04 DIAGNOSIS — R079 Chest pain, unspecified: Secondary | ICD-10-CM

## 2022-05-04 LAB — CBC WITH DIFFERENTIAL/PLATELET
Abs Immature Granulocytes: 0.03 10*3/uL (ref 0.00–0.07)
Basophils Absolute: 0 10*3/uL (ref 0.0–0.1)
Basophils Relative: 0 %
Eosinophils Absolute: 0.1 10*3/uL (ref 0.0–0.5)
Eosinophils Relative: 1 %
HCT: 48.1 % (ref 39.0–52.0)
Hemoglobin: 16.2 g/dL (ref 13.0–17.0)
Immature Granulocytes: 0 %
Lymphocytes Relative: 21 %
Lymphs Abs: 1.9 10*3/uL (ref 0.7–4.0)
MCH: 29.2 pg (ref 26.0–34.0)
MCHC: 33.7 g/dL (ref 30.0–36.0)
MCV: 86.8 fL (ref 80.0–100.0)
Monocytes Absolute: 1 10*3/uL (ref 0.1–1.0)
Monocytes Relative: 10 %
Neutro Abs: 6.1 10*3/uL (ref 1.7–7.7)
Neutrophils Relative %: 68 %
Platelets: 306 10*3/uL (ref 150–400)
RBC: 5.54 MIL/uL (ref 4.22–5.81)
RDW: 13.2 % (ref 11.5–15.5)
WBC: 9.1 10*3/uL (ref 4.0–10.5)
nRBC: 0 % (ref 0.0–0.2)

## 2022-05-04 LAB — BASIC METABOLIC PANEL
Anion gap: 8 (ref 5–15)
BUN: 15 mg/dL (ref 6–20)
CO2: 27 mmol/L (ref 22–32)
Calcium: 9.2 mg/dL (ref 8.9–10.3)
Chloride: 101 mmol/L (ref 98–111)
Creatinine, Ser: 1.18 mg/dL (ref 0.61–1.24)
GFR, Estimated: >60 mL/min (ref >60)
Glucose, Bld: 110 mg/dL — ABNORMAL HIGH (ref 70–99)
Potassium: 3.7 mmol/L (ref 3.5–5.1)
Sodium: 136 mmol/L (ref 135–145)

## 2022-05-04 LAB — TROPONIN I (HIGH SENSITIVITY)
Troponin I (High Sensitivity): <2 ng/L (ref <18)
Troponin I (High Sensitivity): <2 ng/L (ref <18)

## 2022-05-04 LAB — D-DIMER, QUANTITATIVE: D-Dimer, Quant: <0.27 ug/mL-FEU (ref 0.00–0.50)

## 2022-05-04 NOTE — ED Provider Notes (Signed)
[facility] EMERGENCY DEPARTMENT Provider Note   CSN: [id number] Arrival date & time: 05/04/22  0014     History  Chief Complaint  Patient presents with   Chest Pain    David Cain is a 36 y.o. male.  Patient presents to the emergency department for evaluation of chest pain.  Patient reports that he has been experiencing intermittent episodes of tightness across his chest with some pain in his left shoulder for the last 2 days.  He reports that the episodes usually last for a few minutes and then resolved.  Episodes are not related to exertion.  He does not have any associated shortness of breath.  Patient does have a history of hypertension, no other risk factors.       Home Medications Prior to Admission medications   Medication Sig Start Date End Date Taking? Authorizing Provider  hydrochlorothiazide (HYDRODIURIL) 12.5 MG tablet Take 12.5 mg by mouth daily. 07/11/19   [provider]      Allergies    Patient has no known allergies.    Review of Systems   Review of Systems  Physical Exam Updated Vital Signs BP (!) 134/98   Pulse 76   Temp 98.9 F (37.2 C) (Oral)   Resp (!) 23   SpO2 98%  Physical Exam Vitals and nursing note reviewed.  Constitutional:      General: He is not in acute distress.    Appearance: He is well-developed.  HENT:     Head: Normocephalic and atraumatic.     Mouth/Throat:     Mouth: Mucous membranes are moist.  Eyes:     General: Vision grossly intact. Gaze aligned appropriately.     Extraocular Movements: Extraocular movements intact.     Conjunctiva/sclera: Conjunctivae normal.  Cardiovascular:     Rate and Rhythm: Regular rhythm. Tachycardia present.     Pulses: Normal pulses.     Heart sounds: Normal heart sounds, S1 normal and S2 normal. No murmur heard.    No friction rub. No gallop.  Pulmonary:     Effort: Pulmonary effort is normal. No respiratory distress.     Breath sounds: Normal breath sounds.   Abdominal:     Palpations: Abdomen is soft.     Tenderness: There is no abdominal tenderness. There is no guarding or rebound.     Hernia: No hernia is present.  Musculoskeletal:        General: No swelling.     Cervical back: Full passive range of motion without pain, normal range of motion and neck supple. No pain with movement, spinous process tenderness or muscular tenderness. Normal range of motion.     Right lower leg: No edema.     Left lower leg: No edema.  Skin:    General: Skin is warm and dry.     Capillary Refill: Capillary refill takes less than 2 seconds.     Findings: No ecchymosis, erythema, lesion or wound.  Neurological:     Mental Status: He is alert and oriented to person, place, and time.     GCS: GCS eye subscore is 4. GCS verbal subscore is 5. GCS motor subscore is 6.     Cranial Nerves: Cranial nerves 2-12 are intact.     Sensory: Sensation is intact.     Motor: Motor function is intact. No weakness or abnormal muscle tone.     Coordination: Coordination is intact.  Psychiatric:        Mood and Affect:  Mood is anxious.        Speech: Speech normal.        Behavior: Behavior normal.     ED Results / Procedures / Treatments   Labs (all labs ordered are listed, but only abnormal results are displayed) Labs Reviewed  BASIC METABOLIC PANEL - Abnormal; Notable for the following components:      Result Value   Glucose, Bld 110 (*)    All other components within normal limits  CBC WITH DIFFERENTIAL/PLATELET  D-DIMER, QUANTITATIVE  TROPONIN I (HIGH SENSITIVITY)  TROPONIN I (HIGH SENSITIVITY)    EKG EKG Interpretation  Date/Time:  Friday May 04 2022 00:42:44 EDT Ventricular Rate:  110 PR Interval:  178 QRS Duration: 84 QT Interval:  324 QTC Calculation: 439 R Axis:   57 Text Interpretation: Sinus tachycardia Borderline T abnormalities, diffuse leads Confirmed by Orpah Greek [id number]) on 05/04/2022 12:52:16 AM  Radiology DG Chest 2  View  Result Date: 05/04/2022 CLINICAL DATA:  Chest pain and tightness for 2 days.  Left arm pain. EXAM: CHEST - 2 VIEW COMPARISON:  03/15/2016 FINDINGS: Shallow inspiration. Atelectasis in the lung bases. Heart size and pulmonary vascularity are normal. No airspace disease or consolidation in the lungs. No pleural effusions. No pneumothorax. Mediastinal contours appear intact. IMPRESSION: Shallow inspiration with mild atelectasis in the lung bases. Electronically Signed   By: Lucienne Capers M.D.   On: 05/04/2022 01:06    Procedures Procedures    Medications Ordered in ED Medications - No data to display  ED Course/ Medical Decision Making/ A&P                           Medical Decision Making Amount and/or Complexity of Data Reviewed Labs: ordered. Decision-making details documented in ED Course. Radiology: ordered and independent interpretation performed. Decision-making details documented in ED Course. ECG/medicine tests: ordered and independent interpretation performed. Decision-making details documented in ED Course.   Presents for chest pain.  Patient's only cardiac risk factors are hypertension and slightly elevated BMI.  Patient's symptoms are atypical.  Cardiac etiology, PE, GI etiology, musculoskeletal chest pain all considered.  Patient's EKG nonspecific, no obvious ischemia, no ST elevations.  Troponin negative x2.  D-dimer negative.  Patient slightly tachycardic at arrival but he is very anxious.  No pulmonary symptoms, denies shortness of breath.  Pain is not pleuritic.  No independent DVT/PE risk factors.  Wells criteria low risk.  No further work-up necessary.  Patient's evaluation has been reassuring.  Etiology unclear but not likely secondary to ACS or other life-threatening condition.  Patient appropriate for discharge, follow-up with primary care as an outpatient.        Final Clinical Impression(s) / ED Diagnoses Final diagnoses:  Chest pain, unspecified  type    Rx / DC Orders ED Discharge Orders     None         Orpah Greek, MD 05/04/22 [phone]

## 2022-05-04 NOTE — ED Triage Notes (Signed)
Pt arrived from home via POV w c/o chest tightness thjat began x 2 days ago. Pt states that left upper arm hurts along with the tightness. Denies any SOB, diaphoresis, n/v

## 2023-12-30 ENCOUNTER — Ambulatory Visit
Admission: EM | Admit: 2023-12-30 | Discharge: 2023-12-30 | Disposition: A | Discharge status: Home / Self Care | Attending: Physician Assistant | Admitting: Physician Assistant

## 2023-12-30 DIAGNOSIS — Z113 Encounter for screening for infections with a predominantly sexual mode of transmission: Secondary | ICD-10-CM | POA: Diagnosis not present

## 2023-12-30 NOTE — ED Triage Notes (Signed)
 Pt reports needing STD testing, possible exposures to herpes, pt would also like STD swab, pt wants blood work as well.

## 2023-12-30 NOTE — ED Provider Notes (Signed)
 [facility]    CSN: [id number] Arrival date & time: 12/30/23  1030      History   Chief Complaint No chief complaint on file.   HPI David Cain is a 38 y.o. male.   Patient here for STD testing.  He states he had sexual activity with new male partner 4 days ago.  He denies any sx but he reports he heard she had HSV.      Past Medical History:  Diagnosis Date   Hypertension     There are no active problems to display for this patient.   History reviewed. No pertinent surgical history.     Home Medications    Prior to Admission medications   Medication Sig Start Date End Date Taking? Authorizing Provider  hydrochlorothiazide (HYDRODIURIL) 12.5 MG tablet Take 12.5 mg by mouth daily. 07/11/19   [provider]    Family History Family History  Problem Relation Age of Onset   Hypertension Mother     Social History Social History   Tobacco Use   Smoking status: Never   Smokeless tobacco: Never  Substance Use Topics   Alcohol use: Never   Drug use: Never     Allergies   Patient has no known allergies.   Review of Systems Review of Systems  Constitutional:  Negative for chills, fatigue and fever.  Gastrointestinal:  Negative for abdominal pain, nausea and vomiting.  Genitourinary:  Negative for difficulty urinating, dysuria, flank pain, frequency, genital sores, hematuria, penile discharge, penile pain, penile swelling, scrotal swelling, testicular pain and urgency.  Musculoskeletal:  Negative for back pain.  Skin:  Negative for rash and wound.  Allergic/Immunologic: Negative for environmental allergies and immunocompromised state.  Neurological:  Negative for headaches.  Hematological:  Negative for adenopathy. Does not bruise/bleed easily.  Psychiatric/Behavioral:  Negative for sleep disturbance.      Physical Exam Triage Vital Signs ED Triage Vitals [12/30/23 1056]  Encounter Vitals Group     BP 132/88      Systolic BP Percentile      Diastolic BP Percentile      Pulse Rate 89     Resp 18     Temp 98.6 F (37 C)     Temp Source Oral     SpO2 96 %     Weight      Height      Head Circumference      Peak Flow      Pain Score 0     Pain Loc      Pain Education      Exclude from Growth Chart    No data found.  Updated Vital Signs BP 132/88 (BP Location: Right Arm)   Pulse 89   Temp 98.6 F (37 C) (Oral)   Resp 18   SpO2 96%   Visual Acuity Right Eye Distance:   Left Eye Distance:   Bilateral Distance:    Right Eye Near:   Left Eye Near:    Bilateral Near:     Physical Exam Vitals and nursing note reviewed.  Constitutional:      General: He is not in acute distress.    Appearance: Normal appearance. He is not ill-appearing.  HENT:     Head: Normocephalic and atraumatic.  Eyes:     General: No scleral icterus.    Extraocular Movements: Extraocular movements intact.     Conjunctiva/sclera: Conjunctivae normal.  Pulmonary:     Effort: Pulmonary effort is normal.  No respiratory distress.  Musculoskeletal:        General: Normal range of motion.     Cervical back: Normal range of motion. No rigidity.  Skin:    General: Skin is warm.     Coloration: Skin is not jaundiced.     Findings: No rash.  Neurological:     General: No focal deficit present.     Mental Status: He is alert and oriented to person, place, and time.     Motor: No weakness.     Gait: Gait normal.  Psychiatric:        Mood and Affect: Mood normal.        Behavior: Behavior normal.      UC Treatments / Results  Labs (all labs ordered are listed, but only abnormal results are displayed) Labs Reviewed  CYTOLOGY, (ORAL, ANAL, URETHRAL) ANCILLARY ONLY    EKG   Radiology No results found.  Procedures Procedures (including critical care time)  Medications Ordered in UC Medications - No data to display  Initial Impression / Assessment and Plan / UC Course  I have reviewed the triage  vital signs and the nursing notes.  Pertinent labs & imaging results that were available during my care of the patient were reviewed by me and considered in my medical decision making (see chart for details).     I'll send out GC/CT/Trich testing I explained to patient HIV / RPR testing can take up to 4 - 6 weeks to test positive I further explained the limited benefit of HSV testing and that HSV antibody testing is not recommended He states he will return if he has any lesions as discussed. Final Clinical Impressions(s) / UC Diagnoses   Final diagnoses:  Screen for STD (sexually transmitted disease)   Discharge Instructions      Lab results will be available via mychart in 2 - 5 days. Return if you develop sympoms of herpes as discussed Recommend getting rechecked in 4 - 6 weeks, if concern persists about other STDs.  ED Prescriptions   None    PDMP not reviewed this encounter.   Lavonia Powers, PA-C 12/30/23 1134

## 2023-12-30 NOTE — Discharge Instructions (Addendum)
 Lab results will be available via mychart in 2 - 5 days. Return if you develop sympoms of herpes as discussed Recommend getting rechecked in 4 - 6 weeks, if concern persists about other STDs.

## 2023-12-31 LAB — CYTOLOGY, (ORAL, ANAL, URETHRAL) ANCILLARY ONLY
Chlamydia: NEGATIVE
Comment: NEGATIVE
Comment: NEGATIVE
Comment: NORMAL
Neisseria Gonorrhea: NEGATIVE
Trichomonas: NEGATIVE
